# Patient Record
Sex: Female | Born: 1970 | Race: Black or African American | Hispanic: No | Marital: Married | Smoking: Never smoker
Health system: Southern US, Community
[De-identification: ages and names within clinical notes are randomized; demographics above are authoritative.]

## PROBLEM LIST (undated history)

## (undated) DIAGNOSIS — E119 Type 2 diabetes mellitus without complications: Secondary | ICD-10-CM

## (undated) DIAGNOSIS — E079 Disorder of thyroid, unspecified: Secondary | ICD-10-CM

## (undated) HISTORY — PX: ABDOMINAL HYSTERECTOMY: SHX81

## (undated) HISTORY — DX: Disorder of thyroid, unspecified: E07.9

## (undated) HISTORY — DX: Type 2 diabetes mellitus without complications: E11.9

---

## 2010-01-09 ENCOUNTER — Encounter: Admission: RE | Admit: 2010-01-09 | Discharge: 2010-01-09 | Payer: Self-pay | Admitting: Obstetrics and Gynecology

## 2010-11-26 ENCOUNTER — Other Ambulatory Visit: Payer: Self-pay

## 2010-11-26 DIAGNOSIS — R110 Nausea: Secondary | ICD-10-CM

## 2010-12-02 ENCOUNTER — Ambulatory Visit (HOSPITAL_COMMUNITY)
Admission: RE | Admit: 2010-12-02 | Discharge: 2010-12-02 | Disposition: A | Discharge status: Home / Self Care | Payer: Commercial Managed Care - PPO | Source: Ambulatory Visit

## 2010-12-02 DIAGNOSIS — R110 Nausea: Secondary | ICD-10-CM

## 2010-12-03 ENCOUNTER — Other Ambulatory Visit (HOSPITAL_COMMUNITY): Payer: Self-pay

## 2010-12-03 ENCOUNTER — Other Ambulatory Visit: Payer: Self-pay

## 2010-12-03 DIAGNOSIS — R109 Unspecified abdominal pain: Secondary | ICD-10-CM

## 2010-12-03 DIAGNOSIS — R197 Diarrhea, unspecified: Secondary | ICD-10-CM

## 2010-12-03 DIAGNOSIS — R110 Nausea: Secondary | ICD-10-CM

## 2010-12-15 ENCOUNTER — Other Ambulatory Visit (HOSPITAL_COMMUNITY): Payer: Commercial Managed Care - PPO

## 2010-12-18 ENCOUNTER — Ambulatory Visit (HOSPITAL_COMMUNITY)
Admission: RE | Admit: 2010-12-18 | Discharge: 2010-12-18 | Disposition: A | Discharge status: Home / Self Care | Payer: 59 | Source: Ambulatory Visit | Attending: Internal Medicine | Admitting: Internal Medicine

## 2010-12-18 DIAGNOSIS — R197 Diarrhea, unspecified: Secondary | ICD-10-CM | POA: Insufficient documentation

## 2010-12-18 DIAGNOSIS — R109 Unspecified abdominal pain: Secondary | ICD-10-CM | POA: Insufficient documentation

## 2010-12-18 DIAGNOSIS — R110 Nausea: Secondary | ICD-10-CM | POA: Insufficient documentation

## 2010-12-18 MED ORDER — TECHNETIUM TC 99M MEBROFENIN IV KIT
5.0000 | PACK | Freq: Once | INTRAVENOUS | Status: AC | PRN
  Administered 2010-12-18: 5 via INTRAVENOUS

## 2011-03-12 ENCOUNTER — Other Ambulatory Visit: Payer: Self-pay | Admitting: Obstetrics and Gynecology

## 2011-03-12 DIAGNOSIS — Z1231 Encounter for screening mammogram for malignant neoplasm of breast: Secondary | ICD-10-CM

## 2011-03-16 ENCOUNTER — Ambulatory Visit: Payer: Commercial Managed Care - PPO

## 2011-03-17 ENCOUNTER — Ambulatory Visit
Admission: RE | Admit: 2011-03-17 | Discharge: 2011-03-17 | Disposition: A | Discharge status: Home / Self Care | Payer: Commercial Managed Care - PPO | Source: Ambulatory Visit | Attending: Obstetrics and Gynecology | Admitting: Obstetrics and Gynecology

## 2011-03-17 DIAGNOSIS — Z1231 Encounter for screening mammogram for malignant neoplasm of breast: Secondary | ICD-10-CM

## 2012-04-07 ENCOUNTER — Other Ambulatory Visit: Payer: Self-pay | Admitting: Obstetrics and Gynecology

## 2012-04-07 DIAGNOSIS — Z1231 Encounter for screening mammogram for malignant neoplasm of breast: Secondary | ICD-10-CM

## 2012-04-13 ENCOUNTER — Ambulatory Visit
Admission: RE | Admit: 2012-04-13 | Discharge: 2012-04-13 | Disposition: A | Discharge status: Home / Self Care | Payer: 59 | Source: Ambulatory Visit | Attending: Obstetrics and Gynecology | Admitting: Obstetrics and Gynecology

## 2012-04-13 DIAGNOSIS — Z1231 Encounter for screening mammogram for malignant neoplasm of breast: Secondary | ICD-10-CM

## 2012-05-24 ENCOUNTER — Other Ambulatory Visit (HOSPITAL_COMMUNITY): Payer: Self-pay | Admitting: Nurse Practitioner

## 2012-05-24 DIAGNOSIS — E0590 Thyrotoxicosis, unspecified without thyrotoxic crisis or storm: Secondary | ICD-10-CM

## 2012-06-01 ENCOUNTER — Ambulatory Visit (HOSPITAL_COMMUNITY): Admission: RE | Admit: 2012-06-01 | Payer: 59 | Source: Ambulatory Visit

## 2012-06-06 ENCOUNTER — Ambulatory Visit (HOSPITAL_COMMUNITY): Admission: RE | Admit: 2012-06-06 | Payer: 59 | Source: Ambulatory Visit

## 2012-06-13 ENCOUNTER — Ambulatory Visit (HOSPITAL_COMMUNITY)
Admission: RE | Admit: 2012-06-13 | Discharge: 2012-06-13 | Disposition: A | Discharge status: Home / Self Care | Payer: 59 | Source: Ambulatory Visit | Attending: Internal Medicine | Admitting: Internal Medicine

## 2012-06-13 DIAGNOSIS — R5381 Other malaise: Secondary | ICD-10-CM | POA: Insufficient documentation

## 2012-06-13 DIAGNOSIS — E0590 Thyrotoxicosis, unspecified without thyrotoxic crisis or storm: Secondary | ICD-10-CM | POA: Insufficient documentation

## 2012-06-13 DIAGNOSIS — R5383 Other fatigue: Secondary | ICD-10-CM | POA: Insufficient documentation

## 2012-06-13 DIAGNOSIS — E041 Nontoxic single thyroid nodule: Secondary | ICD-10-CM | POA: Insufficient documentation

## 2012-10-08 ENCOUNTER — Encounter: Payer: 59 | Attending: "Endocrinology

## 2013-04-26 ENCOUNTER — Other Ambulatory Visit: Payer: Self-pay

## 2013-04-26 DIAGNOSIS — Z1231 Encounter for screening mammogram for malignant neoplasm of breast: Secondary | ICD-10-CM

## 2013-05-11 ENCOUNTER — Ambulatory Visit
Admission: RE | Admit: 2013-05-11 | Discharge: 2013-05-11 | Disposition: A | Discharge status: Home / Self Care | Payer: 59 | Source: Ambulatory Visit

## 2013-05-11 DIAGNOSIS — Z1231 Encounter for screening mammogram for malignant neoplasm of breast: Secondary | ICD-10-CM

## 2013-08-16 ENCOUNTER — Encounter: Payer: Self-pay | Admitting: Women's Health

## 2013-08-16 ENCOUNTER — Ambulatory Visit (INDEPENDENT_AMBULATORY_CARE_PROVIDER_SITE_OTHER): Payer: 59 | Admitting: Women's Health

## 2013-08-16 VITALS — BP 128/80 | Ht 64.5 in | Wt 223.2 lb

## 2013-08-16 DIAGNOSIS — Z79890 Hormone replacement therapy (postmenopausal): Secondary | ICD-10-CM

## 2013-08-16 DIAGNOSIS — E119 Type 2 diabetes mellitus without complications: Secondary | ICD-10-CM

## 2013-08-16 DIAGNOSIS — E0590 Thyrotoxicosis, unspecified without thyrotoxic crisis or storm: Secondary | ICD-10-CM

## 2013-08-16 DIAGNOSIS — Z01419 Encounter for gynecological examination (general) (routine) without abnormal findings: Secondary | ICD-10-CM

## 2013-08-16 MED ORDER — ESTRADIOL 1 MG PO TABS: 1.0000 mg | ORAL_TABLET | Freq: Every day | ORAL | Status: AC

## 2013-08-16 NOTE — Progress Notes (Signed)
Yolanda Allen Jan 25, 1971 [id number]    History:    New patient presents for annual exam.  TAH with BSO/ benign cysts on estradiol, surgery in [place]. Normal Paps and mammograms. Type 2 diabetes, hyperthyroid, endocrinologist in [place].  Sister with breast cancer mastectomy/BRCA negative.  Past medical history, past surgical history, family history and social history were all reviewed and documented in the EPIC chart. 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 Mother, sister diabetes. Mother had a stroke at age 36. Father hypertension. Son's 22 and 20,  Amari 17 all doing well.  ROS:  A  ROS was performed and pertinent positives and negatives are included in the history.  Exam:  Filed Vitals:   08/16/13 1440  BP: 128/80    General appearance:  Normal Head/Neck:  Normal, without cervical or supraclavicular adenopathy. Thyroid:  Symmetrical, normal in size, without palpable masses or nodularity. Respiratory  Effort:  Normal  Auscultation:  Clear without wheezing or rhonchi Cardiovascular  Auscultation:  Regular rate, without rubs, murmurs or gallops  Edema/varicosities:  Not grossly evident Abdominal  Soft,nontender, without masses, guarding or rebound.  Liver/spleen:  No organomegaly noted  Hernia:  None appreciated  Skin  Inspection:  Grossly normal  Palpation:  Grossly normal Neurologic/psychiatric  Orientation:  Normal with appropriate conversation.  Mood/affect:  Normal  Genitourinary    Breasts: Examined lying and sitting.     Right: Without masses, retractions, discharge or axillary adenopathy.     Left: Without masses, retractions, discharge or axillary adenopathy.   Inguinal/mons:  Normal without inguinal adenopathy  External genitalia:  Normal  BUS/Urethra/Skene's glands:  Normal  Bladder:  Normal  Vagina:  Normal  Cervix:  Absent  Uterus:  Absent   Adnexa/parametria:     Rt: Without masses or tenderness.   Lt: Without masses or tenderness.  Anus  and perineum: Normal  Digital rectal exam: Normal sphincter tone without palpated masses or tenderness  Assessment/Plan:  42 y.o. MBF G3P3 for annual exam with no complaints.  TAH with BSO on estradiol Diabetes/hyperthyroid-endocrinologist labs and meds Obesity  Plan: SBE's, continue annual mammogram, calcium rich diet, vitamin D 2000 daily encouraged. Reviewed importance of continuing regular exercise and decreasing calories for continued weight loss. Estradiol 1 mg daily, prescription, risk for blood clots, strokes, breast cancer reviewed. Reviewed decreasing dose. Pap screening reviewed.    Harrington Challenger [facility], 3:29 PM 08/16/2013

## 2013-08-16 NOTE — Patient Instructions (Signed)

## 2014-01-16 ENCOUNTER — Ambulatory Visit: Payer: Self-pay | Admitting: Bariatrics

## 2014-03-02 ENCOUNTER — Ambulatory Visit: Payer: Self-pay | Admitting: Bariatrics

## 2014-03-28 ENCOUNTER — Ambulatory Visit: Payer: Self-pay | Admitting: Bariatrics

## 2014-04-28 ENCOUNTER — Ambulatory Visit: Payer: Self-pay | Admitting: Bariatrics

## 2014-05-29 ENCOUNTER — Ambulatory Visit: Payer: Self-pay | Admitting: Bariatrics

## 2014-06-01 ENCOUNTER — Other Ambulatory Visit: Payer: Self-pay

## 2014-06-01 DIAGNOSIS — Z1231 Encounter for screening mammogram for malignant neoplasm of breast: Secondary | ICD-10-CM

## 2014-06-12 ENCOUNTER — Ambulatory Visit: Payer: 59

## 2014-06-19 ENCOUNTER — Ambulatory Visit
Admission: RE | Admit: 2014-06-19 | Discharge: 2014-06-19 | Disposition: A | Discharge status: Home / Self Care | Payer: 59 | Source: Ambulatory Visit

## 2014-06-19 DIAGNOSIS — Z1231 Encounter for screening mammogram for malignant neoplasm of breast: Secondary | ICD-10-CM

## 2014-07-30 ENCOUNTER — Encounter: Payer: Self-pay | Admitting: Women's Health

## 2014-09-03 ENCOUNTER — Ambulatory Visit: Payer: Self-pay | Admitting: Bariatrics

## 2014-09-11 ENCOUNTER — Inpatient Hospital Stay: Payer: Self-pay | Admitting: Bariatrics

## 2014-09-12 LAB — BASIC METABOLIC PANEL
ANION GAP: 6 — AB (ref 7–16)
BUN: 5 mg/dL — AB (ref 7–18)
CALCIUM: 7.5 mg/dL — AB (ref 8.5–10.1)
CO2: 24 mmol/L (ref 21–32)
Chloride: 107 mmol/L (ref 98–107)
Creatinine: 0.73 mg/dL (ref 0.60–1.30)
GLUCOSE: 127 mg/dL — AB (ref 65–99)
Osmolality: 273 (ref 275–301)
Potassium: 3.8 mmol/L (ref 3.5–5.1)
Sodium: 137 mmol/L (ref 136–145)

## 2014-09-12 LAB — CBC WITH DIFFERENTIAL/PLATELET
BASOS ABS: 0 10*3/uL (ref 0.0–0.1)
Basophil %: 0.4 %
EOS ABS: 0.1 10*3/uL (ref 0.0–0.7)
Eosinophil %: 0.6 %
HCT: 37.2 % (ref 35.0–47.0)
HGB: 12.1 g/dL (ref 12.0–16.0)
LYMPHS ABS: 2.4 10*3/uL (ref 1.0–3.6)
LYMPHS PCT: 27.9 %
MCH: 29.3 pg (ref 26.0–34.0)
MCHC: 32.4 g/dL (ref 32.0–36.0)
MCV: 90 fL (ref 80–100)
Monocyte #: 0.4 x10 3/mm (ref 0.2–0.9)
Monocyte %: 4.7 %
NEUTROS ABS: 5.8 10*3/uL (ref 1.4–6.5)
Neutrophil %: 66.4 %
Platelet: 269 10*3/uL (ref 150–440)
RBC: 4.12 10*6/uL (ref 3.80–5.20)
RDW: 13.8 % (ref 11.5–14.5)
WBC: 8.7 10*3/uL (ref 3.6–11.0)

## 2014-09-12 LAB — PHOSPHORUS: Phosphorus: 2.3 mg/dL — ABNORMAL LOW (ref 2.5–4.9)

## 2014-09-12 LAB — MAGNESIUM: Magnesium: 1.6 mg/dL — ABNORMAL LOW

## 2014-09-12 LAB — ALBUMIN: ALBUMIN: 2.6 g/dL — AB (ref 3.4–5.0)

## 2014-10-01 ENCOUNTER — Ambulatory Visit: Payer: Self-pay | Admitting: Bariatrics

## 2014-10-06 ENCOUNTER — Emergency Department: Payer: Self-pay | Admitting: Student

## 2014-10-06 LAB — URINALYSIS, COMPLETE
BLOOD: NEGATIVE
Glucose,UR: 50 mg/dL (ref 0–75)
Hyaline Cast: 4
LEUKOCYTE ESTERASE: NEGATIVE
Nitrite: NEGATIVE
PH: 6 (ref 4.5–8.0)
SPECIFIC GRAVITY: 1.025 (ref 1.003–1.030)
Squamous Epithelial: 9
WBC UR: 7 /HPF (ref 0–5)

## 2014-10-06 LAB — COMPREHENSIVE METABOLIC PANEL
ALBUMIN: 3.7 g/dL (ref 3.4–5.0)
ALK PHOS: 101 U/L
ALT: 43 U/L
AST: 29 U/L (ref 15–37)
Anion Gap: 16 (ref 7–16)
BUN: 8 mg/dL (ref 7–18)
Bilirubin,Total: 0.8 mg/dL (ref 0.2–1.0)
CALCIUM: 9.6 mg/dL (ref 8.5–10.1)
CO2: 22 mmol/L (ref 21–32)
Chloride: 98 mmol/L (ref 98–107)
Creatinine: 0.94 mg/dL (ref 0.60–1.30)
EGFR (African American): >60
GLUCOSE: 142 mg/dL — AB (ref 65–99)
OSMOLALITY: 273 (ref 275–301)
Potassium: 3.3 mmol/L — ABNORMAL LOW (ref 3.5–5.1)
SODIUM: 136 mmol/L (ref 136–145)
Total Protein: 8.6 g/dL — ABNORMAL HIGH (ref 6.4–8.2)

## 2014-10-06 LAB — LIPASE, BLOOD: LIPASE: 262 U/L (ref 73–393)

## 2014-10-06 LAB — CBC WITH DIFFERENTIAL/PLATELET
BASOS ABS: 0 10*3/uL (ref 0.0–0.1)
BASOS PCT: 0.5 %
EOS ABS: 0.2 10*3/uL (ref 0.0–0.7)
EOS PCT: 4.2 %
HCT: 48 % — ABNORMAL HIGH (ref 35.0–47.0)
HGB: 15.7 g/dL (ref 12.0–16.0)
LYMPHS PCT: 43.3 %
Lymphocyte #: 2.2 10*3/uL (ref 1.0–3.6)
MCH: 28.7 pg (ref 26.0–34.0)
MCHC: 32.7 g/dL (ref 32.0–36.0)
MCV: 88 fL (ref 80–100)
MONOS PCT: 9.5 %
Monocyte #: 0.5 x10 3/mm (ref 0.2–0.9)
NEUTROS ABS: 2.2 10*3/uL (ref 1.4–6.5)
NEUTROS PCT: 42.5 %
Platelet: 284 10*3/uL (ref 150–440)
RBC: 5.47 10*6/uL — AB (ref 3.80–5.20)
RDW: 13.4 % (ref 11.5–14.5)
WBC: 5.1 10*3/uL (ref 3.6–11.0)

## 2014-10-08 LAB — URINE CULTURE

## 2014-10-29 ENCOUNTER — Ambulatory Visit: Payer: Self-pay | Admitting: Bariatrics

## 2014-12-07 ENCOUNTER — Other Ambulatory Visit: Payer: Self-pay | Admitting: Women's Health

## 2015-01-19 NOTE — Op Note (Signed)
PATIENT NAME:  Yolanda PootMORRISON, Yolanda Allen MR#:  [id number] DATE OF BIRTH:  April 19, 1971  DATE OF PROCEDURE:  09/11/2014  PROCEDURE PERFORMED: Laparoscopic sleeve gastrectomy with repair of hiatal hernia.   PHYSICIAN IN ATTENDANCE: Effie ShyMichael Tyner, MD.    ASSISTANT:  Anabel Halonon Drinkwater, PA.   PREOPERATIVE DIAGNOSES:  1.  Long-standing obesity with multiple attempts at dieting.  2.  Gastroesophageal reflux symptoms associated with presence of moderate reflux as noted by upper gastrointestinal series.    POSTOPERATIVE DIAGNOSES:  1.  Long-standing obesity with multiple attempts at dieting.  2.  Gastroesophageal reflux symptoms associated with presence of moderate reflux as noted by upper gastrointestinal series.   3.  Presence of small hiatal hernia with approximately 1.5 cm sliding defect.   PROCEDURE NOTE : The patient was brought to the operating room and placed in supine position. General anesthesia obtained with orotracheal intubation. The patient had TED hose and Thromboguards were applied, a foot board applied at the end of the operative bed. The chest and abdomen sterilely prepped and draped. A 5 mm Optiview trocar introduced in the left upper quadrant of the abdomen under direct visualization. Pneumoperitoneum obtained with carbon dioxide. Three additional trocars introduced across the upper abdomen and a Nathanson liver retractor introduced through a subxiphoid defect. There was noted to be moderate indentation in the region of the hiatus with some evidence of a sliding defect. Prior to dissecting any significant amount around the hiatus the patient had release of the upper fundus from the undersurface of the left hemidiaphragm and again this confirmed presence of the GE junction sliding within the lower mediastinum consistent with preoperative upper GI series. At this point it was decided to proceed with repair of this to prevent potential for reflux disease postoperatively. The patient had division of the  gastrohepatic ligament followed by division of the peritoneum across the anterior hiatus. Blunt dissection was then used to mobilize the peritoneum from the overlying pericardium and also mobilize the anterior vagal nerve and anterior esophagus from the overlying pericardium. Dissection was extended cephalad over a distance of approximately 6-7 cm. The peritoneum incised just lateral to the right crus and phrenoesophageal ligaments divided along the medial margins of the left crus. Circumferential dissection of the lower esophagus was then performed sweeping away from the underlying aorta, the pleural surfaces and the pericardium. This was extended superiorly over a distance of approximately 8 cm ultimately resulting in delivery of 1.5 cm of esophagus lying comfortably in the abdominal cavity. Three interrupted 0 Ethibond sutures were used to approximate the crural musculature posteriorly. The pylorus was then marked and beginning approximately 4 cm proximal to this there was division of vascular pedicles along the greater curvature of the stomach. This was directed proximally over a distance of approximately 8 cm. Posterior attachments of the stomach to the pancreas then divided. A series of GIA staple firings then used to create a medially based gastric tube effect. The first was a black load stapler placed in relative transverse direction, it was followed by a gold load stapler also placed in a transverse direction in an effort to avoid narrowing of the incisura. A 336 French ViSiGi was then directed into the initial aspects of the tube. This was then followed by additional firings of gold load stapler ultimately brought out just lateral to the angle of His leaving a small dog ear of stomach. As this was done the ViSiGi device was gradually withdrawn to create a consistent tubular effect. The ViSiGi device was then  directed distally and the stomach occluded distally. Insufflation of carbon dioxide performed with a  saline bath, no air leak identified. The ViSiGi device was withdrawn at this point. The upper dog ear aspect of the fundus then tacked to the left crural musculature in an effort to prevent proximal migration of the stomach into the lower mediastinum. Residual vascular pedicles along the greater curvatures stomach divided, freeing the stomach from the gastrosplenic and gastrocolic ligament. The lateral stomach then retrieved through the right upper quadrant 15 mm trocar site followed by closure of the fascia and peritoneum with a 0 Vicryl suture passed by way of a needle suture system under direct visualization. The wounds injected with 0.25% Marcaine after removal of the trocars and release of the pneumoperitoneum.  The wounds were closed with 4-0 Monocryl followed by Dermabond and the patient allowed to recover, having tolerated the procedure well.    ____________________________ Tyrone Apple. Alva Garnet, MD mat:bu D: 09/11/2014 14:04:54 ET T: 09/11/2014 14:19:01 ET JOB#: [id number]  cc: Casimiro Needle A. Alva Garnet, MD, <Dictator> Everette Rank MD ELECTRONICALLY SIGNED 09/12/2014 8:38

## 2015-01-21 LAB — SURGICAL PATHOLOGY

## 2015-12-01 IMAGING — RF DG UGI W/O KUB
3 series · 12 of 12 positions shown · non-contrast
Comparison: None.

CLINICAL DATA: Morbid obesity.  Preop.

EXAM:
UPPER GI SERIES WITHOUT KUB
TECHNIQUE: Routine upper GI series was performed with thin and high density
barium.
FLUOROSCOPY TIME:  2 min 0 seconds

[Series 1: fluoro_barium 2fps_bw · 0.17mm/px · 1 of 1 slices shown (1 of 3)]
[im 1/1]
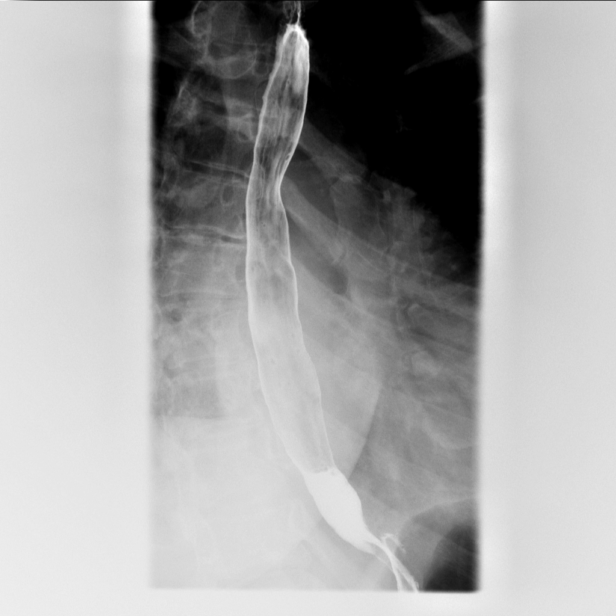

[Series 2: fluoro_barium 2fps_bw · 0.17mm/px · 1 of 1 slices shown (2 of 3)]
[im 1/1]
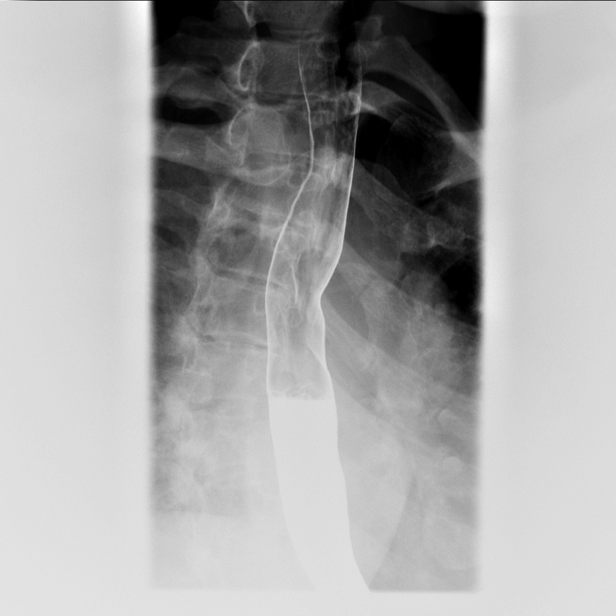

[Series 3: fluoro_barium 2fps_bw · 0.19mm/px · 10 of 10 slices shown (3 of 3)]
[im 1/10]
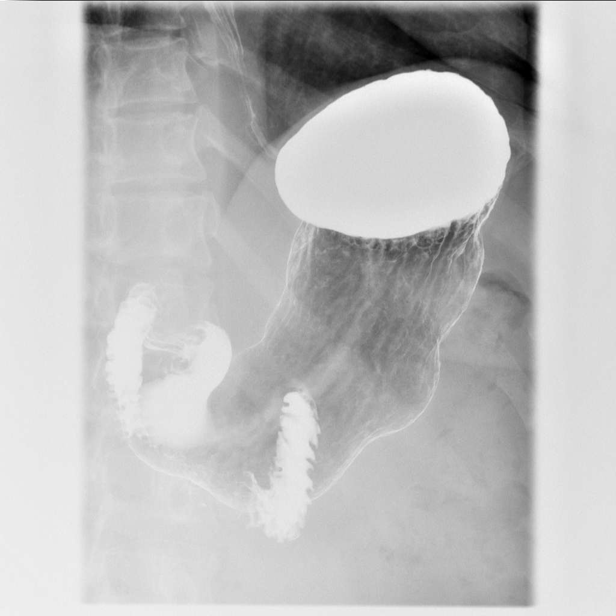
[im 2/10]
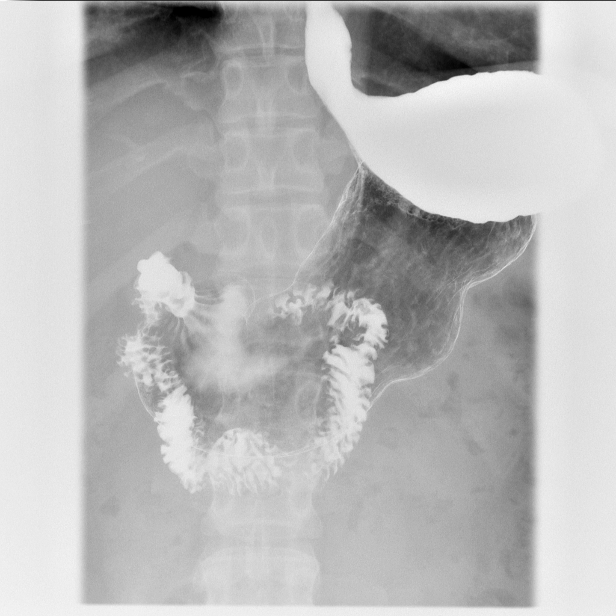
[im 3/10]
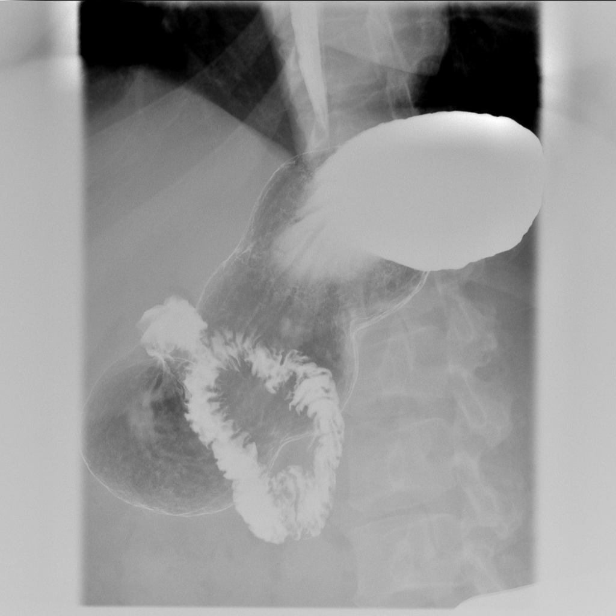
[im 4/10]
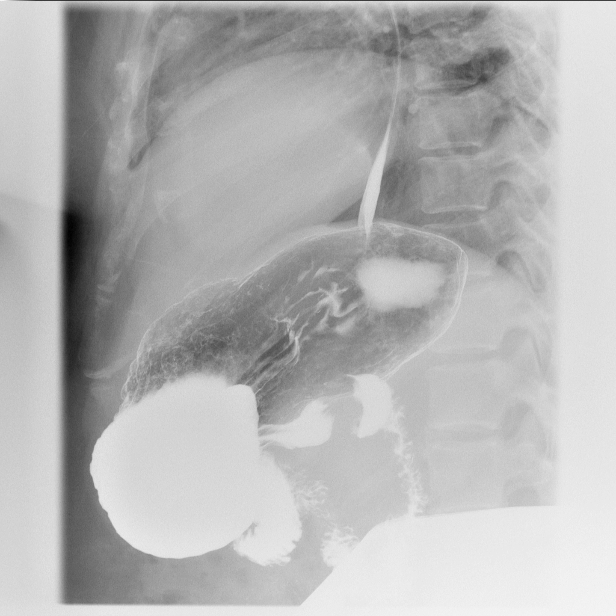
[im 5/10]
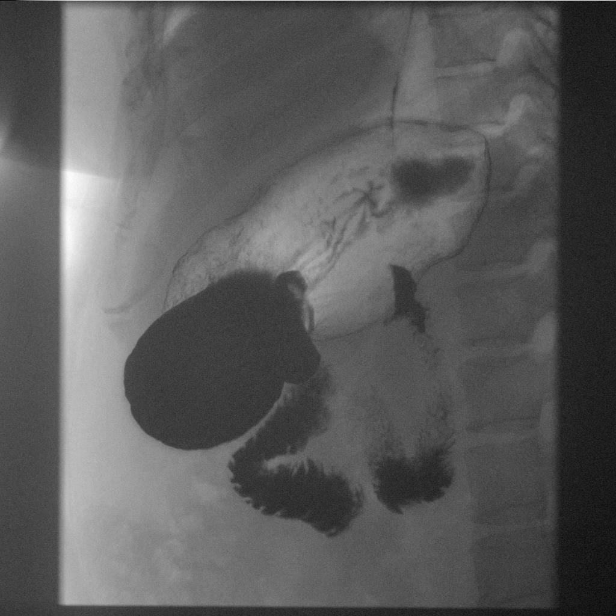
[im 6/10]
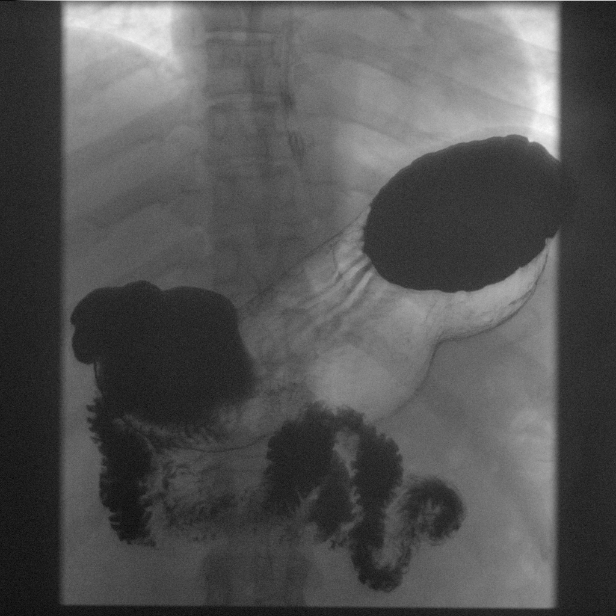
[im 7/10]
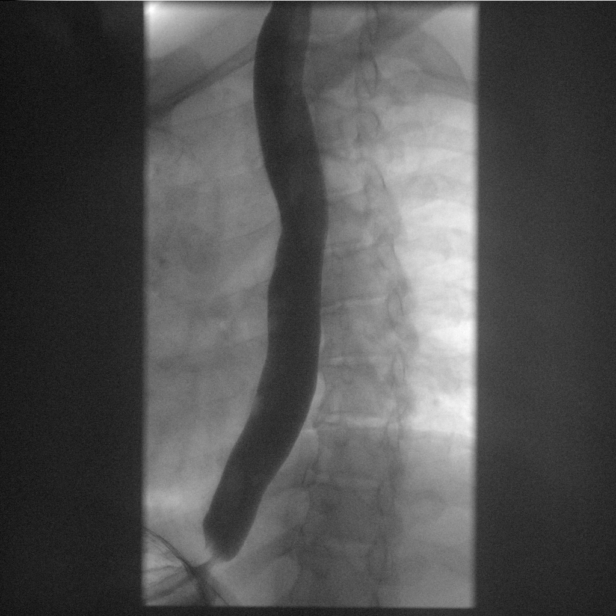
[im 8/10]
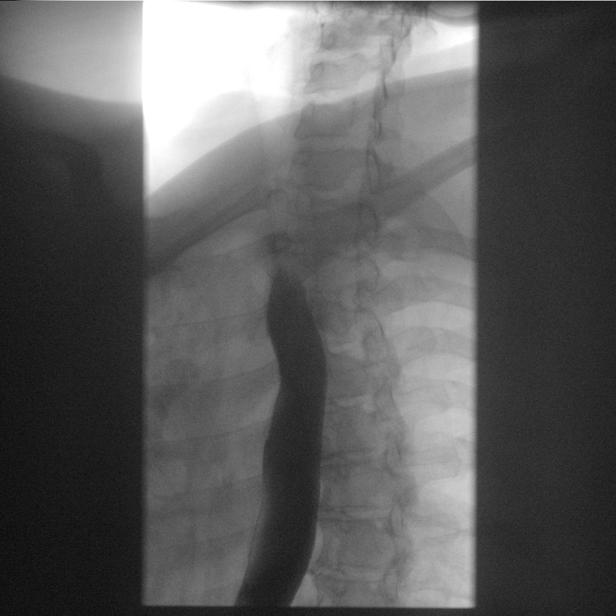
[im 9/10]
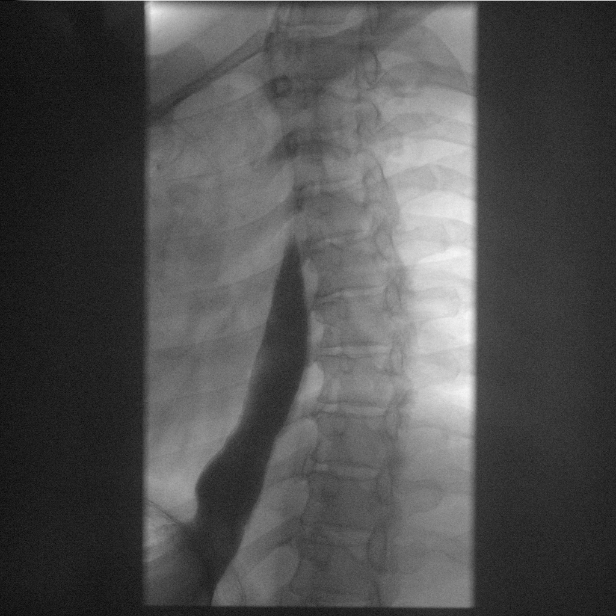
[im 10/10]
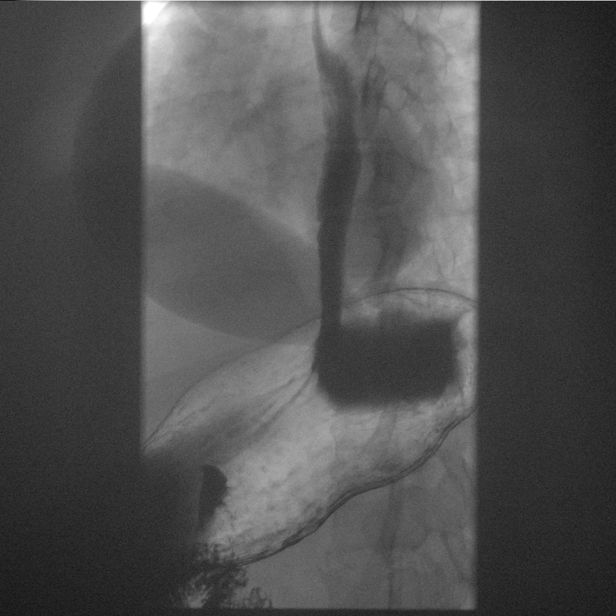

[12 of 12 positions shown; findings below may reference images not displayed]

FINDINGS: The esophagus was normal in contour without evidence of stricture or
mass. Esophageal peristalsis was unremarkable. Contrast passed
readily through the GE junction into the stomach. The stomach was
normal in configuration without evidence of mass or mucosal
abnormality. Contrast passed readily into the duodenum, which was
unremarkable in appearance. Duodenal-jejunal junction was normal in
position. Gastroesophageal reflux was observed during the water
siphon maneuver. A barium tablet passed without delay.
IMPRESSION: 1. Unremarkable appearance of the esophagus and stomach.
2. Gastroesophageal reflux.
